# Patient Record
Sex: Male | Born: 1995 | Race: White | Hispanic: No | Marital: Single | Smoking: Never smoker
Health system: Southern US, Community
[De-identification: ages and names within clinical notes are randomized; demographics above are authoritative.]

## PROBLEM LIST (undated history)

## (undated) DIAGNOSIS — F419 Anxiety disorder, unspecified: Secondary | ICD-10-CM

---

## 2020-09-23 ENCOUNTER — Emergency Department (HOSPITAL_BASED_OUTPATIENT_CLINIC_OR_DEPARTMENT_OTHER)
Admission: EM | Admit: 2020-09-23 | Discharge: 2020-09-24 | Disposition: A | Discharge status: Home / Self Care | Payer: Self-pay | Attending: Emergency Medicine | Admitting: Emergency Medicine

## 2020-09-23 ENCOUNTER — Emergency Department (HOSPITAL_BASED_OUTPATIENT_CLINIC_OR_DEPARTMENT_OTHER): Payer: Self-pay

## 2020-09-23 ENCOUNTER — Encounter (HOSPITAL_BASED_OUTPATIENT_CLINIC_OR_DEPARTMENT_OTHER): Payer: Self-pay | Admitting: *Deleted

## 2020-09-23 ENCOUNTER — Other Ambulatory Visit: Payer: Self-pay

## 2020-09-23 ENCOUNTER — Emergency Department (HOSPITAL_BASED_OUTPATIENT_CLINIC_OR_DEPARTMENT_OTHER): Payer: Medicaid Other | Admitting: Radiology

## 2020-09-23 DIAGNOSIS — M546 Pain in thoracic spine: Secondary | ICD-10-CM | POA: Insufficient documentation

## 2020-09-23 DIAGNOSIS — T07XXXA Unspecified multiple injuries, initial encounter: Secondary | ICD-10-CM

## 2020-09-23 DIAGNOSIS — M542 Cervicalgia: Secondary | ICD-10-CM | POA: Insufficient documentation

## 2020-09-23 DIAGNOSIS — Y92410 Unspecified street and highway as the place of occurrence of the external cause: Secondary | ICD-10-CM | POA: Diagnosis not present

## 2020-09-23 DIAGNOSIS — M545 Low back pain, unspecified: Secondary | ICD-10-CM | POA: Diagnosis not present

## 2020-09-23 DIAGNOSIS — R519 Headache, unspecified: Secondary | ICD-10-CM | POA: Diagnosis not present

## 2020-09-23 DIAGNOSIS — S60511A Abrasion of right hand, initial encounter: Secondary | ICD-10-CM | POA: Diagnosis not present

## 2020-09-23 DIAGNOSIS — S5012XA Contusion of left forearm, initial encounter: Secondary | ICD-10-CM | POA: Insufficient documentation

## 2020-09-23 DIAGNOSIS — S59912A Unspecified injury of left forearm, initial encounter: Secondary | ICD-10-CM | POA: Diagnosis present

## 2020-09-23 HISTORY — DX: Anxiety disorder, unspecified: F41.9

## 2020-09-23 MED ORDER — METHOCARBAMOL 750 MG PO TABS: 750.0000 mg | ORAL_TABLET | Freq: Three times a day (TID) | ORAL | 0 refills | Status: AC | PRN

## 2020-09-23 MED ORDER — IBUPROFEN 400 MG PO TABS
600.0000 mg | ORAL_TABLET | Freq: Once | ORAL | Status: AC
  Administered 2020-09-24: 600 mg via ORAL
  Filled 2020-09-23: qty 1

## 2020-09-23 NOTE — Discharge Instructions (Addendum)
Please keep your wounds clean and dry. Please do not soak or submerge your wounds as this can cause them to become infected. You may put a thin layer of antibiotic ointment over your wounds if you desire. As we discussed you will be more sore tomorrow morning when you wake up.  Normally this is on the sides of your neck and back. Please use heat on the sore muscles followed by gentle stretching and range of motion. For your swelling and any bruises please use ice, 20 minutes on, 20 minutes off.  Please take Ibuprofen (Advil, motrin) and Tylenol (acetaminophen) to relieve your pain.    You may take up to 600 MG (3 pills) of normal strength ibuprofen every 8 hours as needed.   You make take tylenol, up to 1,000 mg (two extra strength pills) every 8 hours as needed.   It is safe to take ibuprofen and tylenol at the same time as they work differently.   Do not take more than 3,000 mg tylenol in a 24 hour period (not more than one dose every 8 hours.  Please check all medication labels as many medications such as pain and cold medications may contain tylenol.  Do not drink alcohol while taking these medications.  Do not take other NSAID'S while taking ibuprofen (such as aleve or naproxen).  Please take ibuprofen with food to decrease stomach upset.  You are being prescribed a medication which may make you sleepy. For 24 hours after one dose please do not drive, operate heavy machinery, care for a small child with out another adult present, or perform any activities that may cause harm to you or someone else if you were to fall asleep or be impaired.
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
## 2020-09-23 NOTE — ED Notes (Signed)
Ice pack provided for arm

## 2020-09-23 NOTE — ED Notes (Signed)
Patient transported to CT 

## 2021-08-23 ENCOUNTER — Other Ambulatory Visit: Payer: Self-pay

## 2021-08-23 ENCOUNTER — Emergency Department (HOSPITAL_BASED_OUTPATIENT_CLINIC_OR_DEPARTMENT_OTHER)
Admission: EM | Admit: 2021-08-23 | Discharge: 2021-08-24 | Discharge status: Against Medical Advice | Payer: BC Managed Care – PPO | Attending: Emergency Medicine | Admitting: Emergency Medicine

## 2021-08-23 ENCOUNTER — Encounter (HOSPITAL_BASED_OUTPATIENT_CLINIC_OR_DEPARTMENT_OTHER): Payer: Self-pay

## 2021-08-23 DIAGNOSIS — R519 Headache, unspecified: Secondary | ICD-10-CM | POA: Insufficient documentation

## 2021-08-23 DIAGNOSIS — Y92410 Unspecified street and highway as the place of occurrence of the external cause: Secondary | ICD-10-CM | POA: Insufficient documentation

## 2021-08-23 DIAGNOSIS — Z5321 Procedure and treatment not carried out due to patient leaving prior to being seen by health care provider: Secondary | ICD-10-CM | POA: Diagnosis not present
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
## 2021-08-23 NOTE — ED Notes (Signed)
Slight headache right side and nausea... No blurry vision, uneven gait, vomiting or any other symptoms. ?
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
